# Patient Record
Sex: Male | Born: 1960 | Race: White | Hispanic: No | Marital: Single | Smoking: Current every day smoker
Health system: Southern US, Community
[De-identification: ages and names within clinical notes are randomized; demographics above are authoritative.]

## PROBLEM LIST (undated history)

## (undated) DIAGNOSIS — I440 Atrioventricular block, first degree: Secondary | ICD-10-CM

## (undated) DIAGNOSIS — I10 Essential (primary) hypertension: Secondary | ICD-10-CM

## (undated) HISTORY — DX: Essential (primary) hypertension: I10

---
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
## 2006-03-17 HISTORY — PX: HERNIA REPAIR: SHX51

## 2013-01-20 ENCOUNTER — Emergency Department (HOSPITAL_COMMUNITY): Payer: BC Managed Care – PPO

## 2013-01-20 ENCOUNTER — Encounter (HOSPITAL_COMMUNITY): Payer: Self-pay | Admitting: Emergency Medicine

## 2013-01-20 ENCOUNTER — Emergency Department (HOSPITAL_COMMUNITY)
Admission: EM | Admit: 2013-01-20 | Discharge: 2013-01-20 | Disposition: A | Discharge status: Home / Self Care | Payer: BC Managed Care – PPO | Attending: Emergency Medicine | Admitting: Emergency Medicine

## 2013-01-20 DIAGNOSIS — R296 Repeated falls: Secondary | ICD-10-CM | POA: Insufficient documentation

## 2013-01-20 DIAGNOSIS — M542 Cervicalgia: Secondary | ICD-10-CM | POA: Insufficient documentation

## 2013-01-20 DIAGNOSIS — Z7982 Long term (current) use of aspirin: Secondary | ICD-10-CM | POA: Insufficient documentation

## 2013-01-20 DIAGNOSIS — S0180XA Unspecified open wound of other part of head, initial encounter: Secondary | ICD-10-CM | POA: Insufficient documentation

## 2013-01-20 DIAGNOSIS — Z23 Encounter for immunization: Secondary | ICD-10-CM | POA: Insufficient documentation

## 2013-01-20 DIAGNOSIS — R51 Headache: Secondary | ICD-10-CM | POA: Insufficient documentation

## 2013-01-20 DIAGNOSIS — Z79899 Other long term (current) drug therapy: Secondary | ICD-10-CM | POA: Insufficient documentation

## 2013-01-20 DIAGNOSIS — S0181XA Laceration without foreign body of other part of head, initial encounter: Secondary | ICD-10-CM

## 2013-01-20 DIAGNOSIS — F17200 Nicotine dependence, unspecified, uncomplicated: Secondary | ICD-10-CM | POA: Insufficient documentation

## 2013-01-20 DIAGNOSIS — Y998 Other external cause status: Secondary | ICD-10-CM | POA: Insufficient documentation

## 2013-01-20 DIAGNOSIS — S0990XA Unspecified injury of head, initial encounter: Secondary | ICD-10-CM

## 2013-01-20 DIAGNOSIS — Y929 Unspecified place or not applicable: Secondary | ICD-10-CM | POA: Insufficient documentation

## 2013-01-20 DIAGNOSIS — F1010 Alcohol abuse, uncomplicated: Secondary | ICD-10-CM | POA: Insufficient documentation

## 2013-01-20 DIAGNOSIS — I440 Atrioventricular block, first degree: Secondary | ICD-10-CM | POA: Insufficient documentation

## 2013-01-20 HISTORY — DX: Atrioventricular block, first degree: I44.0

## 2013-01-20 MED ORDER — TETANUS-DIPHTH-ACELL PERTUSSIS 5-2.5-18.5 LF-MCG/0.5 IM SUSP
0.5000 mL | Freq: Once | INTRAMUSCULAR | Status: AC
  Administered 2013-01-20: 0.5 mL via INTRAMUSCULAR
  Filled 2013-01-20: qty 0.5

## 2013-01-20 MED ORDER — NAPROXEN 500 MG PO TABS: 500.0000 mg | ORAL_TABLET | Freq: Two times a day (BID) | ORAL | Status: DC

## 2013-01-20 MED ORDER — LIDOCAINE HCL (PF) 1 % IJ SOLN
5.0000 mL | Freq: Once | INTRAMUSCULAR | Status: AC
  Administered 2013-01-20: 5 mL via INTRADERMAL
  Filled 2013-01-20: qty 5
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
## 2013-01-20 NOTE — ED Notes (Signed)
Dr. Hyacinth Meeker at bedside suturing.  C-collar removed by Dr. Hyacinth Meeker.

## 2014-08-10 ENCOUNTER — Encounter: Payer: Self-pay | Admitting: Emergency Medicine

## 2014-08-10 ENCOUNTER — Emergency Department (INDEPENDENT_AMBULATORY_CARE_PROVIDER_SITE_OTHER): Payer: BLUE CROSS/BLUE SHIELD

## 2014-08-10 ENCOUNTER — Emergency Department
Admission: EM | Admit: 2014-08-10 | Discharge: 2014-08-10 | Disposition: A | Discharge status: Home / Self Care | Payer: BLUE CROSS/BLUE SHIELD | Source: Home / Self Care | Attending: Family Medicine | Admitting: Family Medicine

## 2014-08-10 DIAGNOSIS — R059 Cough, unspecified: Secondary | ICD-10-CM

## 2014-08-10 DIAGNOSIS — R05 Cough: Secondary | ICD-10-CM

## 2014-08-10 DIAGNOSIS — J449 Chronic obstructive pulmonary disease, unspecified: Secondary | ICD-10-CM

## 2014-08-10 DIAGNOSIS — J209 Acute bronchitis, unspecified: Secondary | ICD-10-CM

## 2014-08-10 MED ORDER — DOXYCYCLINE HYCLATE 100 MG PO CAPS: 100.0000 mg | ORAL_CAPSULE | Freq: Two times a day (BID) | ORAL | Status: DC

## 2014-08-10 MED ORDER — BENZONATATE 200 MG PO CAPS: 200.0000 mg | ORAL_CAPSULE | Freq: Every day | ORAL | Status: DC

## 2014-08-10 NOTE — ED Notes (Signed)
Congestion, rt ear pain, fatigue, cough, a little diarrhea, chills, slight body aches x 1 week

## 2014-08-10 NOTE — Discharge Instructions (Signed)
Take plain guaifenesin 1200mg (Mucinex) twice daily, with plenty of water, for cough and congestion.  Get adequate rest.   °May use Afrin nasal spray (or generic oxymetazoline) twice daily for about 5 days.  Also recommend using saline nasal spray several times daily and saline nasal irrigation (AYR is a common brand) °Try warm salt water gargles for sore throat.  °Stop all antihistamines for now, and other non-prescription cough/cold preparations. ° °Follow-up with family doctor if not improving about10 days.  °

## 2014-08-10 NOTE — ED Provider Notes (Signed)
CSN: [id number]     Arrival date & time 08/10/14  0818 History   First MD Initiated Contact with Patient 08/10/14 [phone]     Chief Complaint  Patient presents with  . URI     HPI Comments: Patient complains of onset of flu-like symptoms one week ago.  He has had a persistent productive cough with increased right chest congestion.  He remains excessively fatigued. He had an episode of right sided pneumonia several years ago and feels the same as then.  He continues to smoke.  The history is provided by the patient.    Past Medical History  Diagnosis Date  . Heart block, first degree    History reviewed. No pertinent past surgical history. No family history on file. History  Substance Use Topics  . Smoking status: Current Every Day Smoker -- 1.00 packs/day    Types: Cigarettes  . Smokeless tobacco: Not on file  . Alcohol Use: Yes    Review of Systems + sore throat, resolved + cough No pleuritic pain No wheezing + nasal congestion + post-nasal drainage No sinus pain/pressure No itchy/red eyes + right earache No hemoptysis No SOB No fever, + chills + nausea No vomiting No abdominal pain + diarrhea, resolved No urinary symptoms No skin rash + fatigue + myalgias + headache Used OTC meds without relief  Allergies  Review of patient's allergies indicates no known allergies.  Home Medications   Prior to Admission medications   Medication Sig Start Date End Date Taking? Authorizing Provider  guaiFENesin (MUCINEX) 600 MG 12 hr tablet Take by mouth 2 (two) times daily.   Yes Historical Provider, MD  irbesartan (AVAPRO) 150 MG tablet Take 150 mg by mouth daily.   Yes Historical Provider, MD  aspirin 325 MG tablet Take 325 mg by mouth daily.    Historical Provider, MD  benzonatate (TESSALON) 200 MG capsule Take 1 capsule (200 mg total) by mouth at bedtime. Take as needed for cough 08/10/14   Lattie Haw, MD  doxycycline (VIBRAMYCIN) 100 MG capsule Take 1 capsule (100 mg  total) by mouth 2 (two) times daily. Take with food. 08/10/14   Lattie Haw, MD  IRON PO Take 1 tablet by mouth daily.    Historical Provider, MD  naproxen (NAPROSYN) 500 MG tablet Take 1 tablet (500 mg total) by mouth 2 (two) times daily with a meal. 01/20/13   Vida Roller, MD  POTASSIUM PO Take 1 tablet by mouth daily.    Historical Provider, MD   BP 156/88 mmHg  Pulse 104  Temp(Src) 98.5 F (36.9 C) (Oral)  Ht  (1.778 m)  Wt 159 lb (72.122 kg)  BMI 22.81 kg/m2  SpO2 99% Physical Exam Nursing notes and Vital Signs reviewed. Appearance:  Patient appears stated age, and in no acute distress Eyes:  Pupils are equal, round, and reactive to light and accomodation.  Extraocular movement is intact.  Conjunctivae are not inflamed  Ears:  Canals normal.  Tympanic membranes normal.  Nose:   Congested turbinates, worse on the right.  No sinus tenderness.      Pharynx:  Normal Neck:  Supple.   Tender enlarged posterior nodes are palpated bilaterally  Lungs:  Clear to auscultation.  Breath sounds are equal.  Heart:  Regular rate and rhythm without murmurs, rubs, or gallops.  Rate 108 Abdomen:  Nontender without masses or hepatosplenomegaly.  Bowel sounds are present.  No CVA or flank tenderness.  Extremities:  No edema.  No calf tenderness Skin:  No rash present.   ED Course  Procedures  none      Imaging Review Dg Chest 2 View  08/10/2014   CLINICAL DATA:  Flu-like symptoms for 1 week  EXAM: CHEST  2 VIEW  COMPARISON:  None.  FINDINGS: Cardiac shadow is within normal limits. The lungs are hyperaerated bilaterally consistent with COPD. No focal infiltrate or sizable effusion is seen. No bony abnormality is noted.  IMPRESSION: COPD without acute abnormality.   Electronically Signed   By: Alcide CleverMark  Lukens M.D.   On: 08/10/2014 09:08     MDM   1. Acute bronchitis, unspecified organism   2. Cough    With evidence of COPD and past history of pneumonia in a smoker, begin doxycycline  for atypical coverage.  Prescription written for Benzonatate [facility](Tessalon) to take at bedtime for night-time cough.  Take plain guaifenesin 1200mg  (Mucinex) twice daily, with plenty of water, for cough and congestion.  Get adequate rest.   May use Afrin nasal spray (or generic oxymetazoline) twice daily for about 5 days.  Also recommend using saline nasal spray several times daily and saline nasal irrigation (AYR is a common brand) Try warm salt water gargles for sore throat.  Stop all antihistamines for now, and other non-prescription cough/cold preparations.  Follow-up with family doctor if not improving about10 days.     Lattie HawStephen A Estella Malatesta, MD 08/12/14 1048

## 2014-08-13 ENCOUNTER — Telehealth: Payer: Self-pay | Admitting: Emergency Medicine

## 2014-09-15 ENCOUNTER — Ambulatory Visit (INDEPENDENT_AMBULATORY_CARE_PROVIDER_SITE_OTHER): Payer: BLUE CROSS/BLUE SHIELD | Admitting: Family Medicine

## 2014-09-15 ENCOUNTER — Encounter: Payer: Self-pay | Admitting: Family Medicine

## 2014-09-15 VITALS — BP 155/82 | HR 95 | Ht 70.0 in | Wt 163.0 lb

## 2014-09-15 DIAGNOSIS — F411 Generalized anxiety disorder: Secondary | ICD-10-CM

## 2014-09-15 DIAGNOSIS — I10 Essential (primary) hypertension: Secondary | ICD-10-CM | POA: Diagnosis not present

## 2014-09-15 DIAGNOSIS — F1090 Alcohol use, unspecified, uncomplicated: Secondary | ICD-10-CM | POA: Insufficient documentation

## 2014-09-15 DIAGNOSIS — F1010 Alcohol abuse, uncomplicated: Secondary | ICD-10-CM

## 2014-09-15 DIAGNOSIS — Z7289 Other problems related to lifestyle: Secondary | ICD-10-CM

## 2014-09-15 HISTORY — DX: Essential (primary) hypertension: I10

## 2014-09-15 MED ORDER — VENLAFAXINE HCL ER 75 MG PO CP24: 75.0000 mg | ORAL_CAPSULE | Freq: Every day | ORAL | Status: DC

## 2014-09-15 MED ORDER — LORAZEPAM 1 MG PO TABS: 1.0000 mg | ORAL_TABLET | Freq: Three times a day (TID) | ORAL | Status: DC | PRN

## 2014-09-15 NOTE — Progress Notes (Signed)
CC: Dean Osborne is a 54 y.o. male is here for Establish Care   Subjective: HPI:  Very pleasant 54 year old here to establish care  Patient tells me last week he had numbness on his entire left side of the body and went to a local emergency room and had unremarkable lab work and a normal MRI of the brain. His symptoms were relieved after he received Ativan in the emergency room.    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 He tells me that he constantly is bothered by this and it was interfering with sleep in the past. Last year he was self-medicating with alcohol and admits to drinking to the point of blacking out however as of January 1 use successfully been able to cut back to on average 3 drinks a day, always after work. Since being prescribed Ativan he tells me that he is able to deal with the stress of his work place doesn't seem to be bringing home any irritability or anxiety that he was experiencing in the past. He's taking 1-2 Ativan on a daily basis depending on how stressful work event. He denies any other mental disturbance. He tells me that he thinks about the job situation above constantly, at least every hour. He's had no new neurological or motor/sensory disturbances other than that described above since leaving the emergency room.  History of essential hypertension.  He's been prescribed Avapro in the past but currently is not taking this. There are no known side effects to this medication that he is experiencing.  Review of Systems - General ROS: negative for - chills, fever, night sweats, weight gain or weight loss Ophthalmic ROS: negative for - decreased  vision Psychological ROS: negative for - anxiety ENT ROS: negative for - hearing change, nasal congestion, tinnitus or allergies Hematological and Lymphatic ROS: negative for - bleeding problems, bruising or swollen lymph nodes Breast ROS: negative Respiratory ROS: no cough, shortness of breath, or wheezing Cardiovascular ROS: no chest pain or dyspnea on exertion Gastrointestinal ROS: no abdominal pain, change in bowel habits, or black or bloody stools Genito-Urinary ROS: negative for - genital discharge, genital ulcers, incontinence or abnormal bleeding from genitals Musculoskeletal ROS: negative for - joint pain or muscle pain Neurological ROS: negative for - headaches or memory loss Dermatological ROS: negative for lumps, mole changes, rash and skin lesion changes  Past Medical History  Diagnosis Date  . Heart block, first degree   . Hypertension   . Essential hypertension 09/15/2014    Past Surgical History  Procedure Laterality Date  . Hernia repair  03/2006  . Knee surgery  10/1971   Family History  Problem Relation Age of Onset  . Lung cancer Maternal Grandfather   . Diabetes Maternal Grandmother     History   Social History  . Marital Status: Single    Spouse Name: N/A  . Number of Children: N/A  . Years of Education: N/A   Occupational History  . Not on file.   Social History Main Topics  . Smoking status: Current Every Day Smoker -- 1.00 packs/day for 35 years    Types: Cigarettes  . Smokeless tobacco: Not  on file  . Alcohol Use: 12.0 oz/week    20 Standard drinks or equivalent per week  . Drug Use: No  . Sexual Activity: Not Currently   Other Topics Concern  . Not on file   Social History Narrative     Objective: BP 155/82 mmHg  Pulse 95  Ht 5\' 10"  (1.778 m)  Wt 163 lb (73.936 kg)  BMI 23.39 kg/m2  Vital signs reviewed. General: Alert and Oriented, No Acute Distress HEENT: Pupils equal, round, reactive to light. Conjunctivae clear.  External  ears unremarkable.  Moist mucous membranes. Lungs: Clear and comfortable work of breathing, speaking in full sentences without accessory muscle use. Cardiac: Regular rate and rhythm.  Neuro: CN II-XII grossly intact, gait normal. Extremities: No peripheral edema.  Strong peripheral pulses.  Mental Status: Mild to moderate anxiety without depression or agitation. Logical though process. Skin: Warm and dry. Assessment & Plan: Dean Osborne was seen today for establish care.  Diagnoses and all orders for this visit:  Essential hypertension  Generalized anxiety disorder Orders: -     venlafaxine XR (EFFEXOR XR) 75 MG 24 hr capsule; Take 1 capsule (75 mg total) by mouth daily with breakfast. -     LORazepam (ATIVAN) 1 MG tablet; Take 1 tablet (1 mg total) by mouth every 8 (eight) hours as needed for anxiety.  Habitual alcohol use   Essential hypertension: Uncontrolled chronic condition, Urged to restart Avapro Generalized anxiety disorder: Discussed that daily use of Ativan is not the best course of action for management of his anxiety instead he should start on Effexor, for prescription was provided today. Anticipate he'll need to use Ativan for the next 1 or 2 weeks while Effexor becomes fully effective. Habitual alcohol use: Discussed cutting back on his alcohol use and to ensure that he is not consuming more than 3 alcohol beverages a day.  Return in about 4 weeks (around 10/13/2014) for Mood.

## 2014-10-13 ENCOUNTER — Encounter: Payer: Self-pay | Admitting: Family Medicine

## 2014-10-13 ENCOUNTER — Ambulatory Visit (INDEPENDENT_AMBULATORY_CARE_PROVIDER_SITE_OTHER): Payer: BLUE CROSS/BLUE SHIELD | Admitting: Family Medicine

## 2014-10-13 VITALS — BP 165/86 | HR 80 | Wt 155.0 lb

## 2014-10-13 DIAGNOSIS — I10 Essential (primary) hypertension: Secondary | ICD-10-CM

## 2014-10-13 DIAGNOSIS — F411 Generalized anxiety disorder: Secondary | ICD-10-CM

## 2014-10-13 MED ORDER — IRBESARTAN 150 MG PO TABS: 150.0000 mg | ORAL_TABLET | Freq: Every day | ORAL | Status: DC

## 2014-10-13 MED ORDER — VENLAFAXINE HCL ER 75 MG PO CP24: 75.0000 mg | ORAL_CAPSULE | Freq: Every day | ORAL | Status: DC

## 2014-10-13 MED ORDER — LORAZEPAM 1 MG PO TABS: 1.0000 mg | ORAL_TABLET | Freq: Three times a day (TID) | ORAL | Status: DC | PRN

## 2014-10-13 NOTE — Progress Notes (Signed)
CC: Dean Osborne is a 54 y.o. male is here for f/u mood   Subjective: HPI:   Follow-up anxiety: Work situation has not changed, still highly stressful however he feels like the Effexor is done a significant improvement with him not letting stress get to him. He tells me that he does not dread going to work anymore. He has no difficulty falling asleep now. He does not feel like he brings home stress from work.  He still uses Ativan to help stay asleep. Denies any known side effects from the above medications. Denies thoughts of wanting to self or others. Overall he is extremely happy with the effect of Effexor without any noted side effects.  Follow-up essential hypertension: No outside blood pressures report. No chest pain shortness of breath orthopnea or peripheral edema. He is taking Avapro in the past without any known side effects and it's always brought his blood pressure to the normotensive range per his report. He's been reluctant about restarting this because he wanted to make sure that his other psychiatric medications were stabilized first.   Review Of Systems Outlined In HPI  Past Medical History  Diagnosis Date  . Heart block, first degree   . Hypertension   . Essential hypertension 09/15/2014    Past Surgical History  Procedure Laterality Date  . Hernia repair  03/2006  . Knee surgery  10/1971   Family History  Problem Relation Age of Onset  . Lung cancer Maternal Grandfather   . Diabetes Maternal Grandmother     History   Social History  . Marital Status: Single    Spouse Name: N/A  . Number of Children: N/A  . Years of Education: N/A   Occupational History  . Not on file.   Social History Main Topics  . Smoking status: Current Every Day Smoker -- 1.00 packs/day for 35 years    Types: Cigarettes  . Smokeless tobacco: Not on file  . Alcohol Use: 12.0 oz/week    20 Standard drinks or equivalent per week  . Drug Use: No  . Sexual Activity: Not Currently    Other Topics Concern  . Not on file   Social History Narrative     Objective: BP 165/86 mmHg  Pulse 80  Wt 155 lb (70.308 kg)  Vital signs reviewed. General: Alert and Oriented, No Acute Distress HEENT: Pupils equal, round, reactive to light. Conjunctivae clear.  External ears unremarkable.  Moist mucous membranes. Lungs: Clear and comfortable work of breathing, speaking in full sentences without accessory muscle use. Cardiac: Regular rate and rhythm.  Neuro: CN II-XII grossly intact, gait normal. Extremities: No peripheral edema.  Strong peripheral pulses.  Mental Status: No depression, anxiety, nor agitation. Logical though process. Skin: Warm and dry. Assessment & Plan: Shah was seen today for f/u mood.  Diagnoses and all orders for this visit:  Essential hypertension  Generalized anxiety disorder Orders: -     LORazepam (ATIVAN) 1 MG tablet; Take 1 tablet (1 mg total) by mouth every 8 (eight) hours as needed for anxiety. -     venlafaxine XR (EFFEXOR XR) 75 MG 24 hr capsule; Take 1 capsule (75 mg total) by mouth daily with breakfast.  Other orders -     irbesartan (AVAPRO) 150 MG tablet; Take 1 tablet (150 mg total) by mouth daily.   Anxiety: Greatly improved with Effexor continue as needed Ativan with daily Effexor. Encouraged him to not cut these medications off cold [place] of his work situation improves  instead come back and see me and we'll discuss a taper regimen Essential hypertension: Uncontrolled chronic condition restart Avapro. Return in one month blood pressures are above 140/90 otherwise 6 months.  Return in about 6 months (around 04/15/2015) for mood.

## 2014-12-15 ENCOUNTER — Other Ambulatory Visit: Payer: Self-pay | Admitting: Family Medicine

## 2014-12-16 ENCOUNTER — Other Ambulatory Visit: Payer: Self-pay | Admitting: *Deleted

## 2014-12-16 DIAGNOSIS — F411 Generalized anxiety disorder: Secondary | ICD-10-CM

## 2014-12-16 MED ORDER — LORAZEPAM 1 MG PO TABS: 1.0000 mg | ORAL_TABLET | Freq: Three times a day (TID) | ORAL | Status: DC | PRN

## 2015-02-11 ENCOUNTER — Other Ambulatory Visit: Payer: Self-pay | Admitting: Family Medicine

## 2015-02-16 ENCOUNTER — Ambulatory Visit (INDEPENDENT_AMBULATORY_CARE_PROVIDER_SITE_OTHER): Payer: BLUE CROSS/BLUE SHIELD | Admitting: Family Medicine

## 2015-02-16 ENCOUNTER — Encounter: Payer: Self-pay | Admitting: Family Medicine

## 2015-02-16 VITALS — BP 96/67 | HR 121 | Temp 97.8°F | Wt 145.0 lb

## 2015-02-16 DIAGNOSIS — J189 Pneumonia, unspecified organism: Secondary | ICD-10-CM

## 2015-02-16 MED ORDER — CEFDINIR 300 MG PO CAPS: 300.0000 mg | ORAL_CAPSULE | Freq: Two times a day (BID) | ORAL | Status: DC

## 2015-02-16 NOTE — Progress Notes (Signed)
CC: Dean Osborne is a 54 y.o. male is here for Fatigue and Cough   Subjective: HPI:  Complains of productive cough with green sputum but no blood in sputum. Symptoms are present all hours of the day and somewhat interfere with sleep. Symptoms have been worsening over the last week. Now symptoms are moderate in severity. No benefit from resting no other interventions as of yet. Continues to smoke. Occasional shortness of breath. But never with resting or lying down. Denies wheezing or chest discomfort. Reports fatigue to moderate to severe degree since the cough began. Denies any abdominal pain, diarrhea, constipation, blood in stool, dysuria nor rash.   Review Of Systems Outlined In HPI  Past Medical History  Diagnosis Date  . Heart block, first degree   . Hypertension   . Essential hypertension 09/15/2014    Past Surgical History  Procedure Laterality Date  . Hernia repair  03/2006  . Knee surgery  10/1971   Family History  Problem Relation Age of Onset  . Lung cancer Maternal Grandfather   . Diabetes Maternal Grandmother     History   Social History  . Marital Status: Single    Spouse Name: N/A  . Number of Children: N/A  . Years of Education: N/A   Occupational History  . Not on file.   Social History Main Topics  . Smoking status: Current Every Day Smoker -- 1.00 packs/day for 35 years    Types: Cigarettes  . Smokeless tobacco: Not on file  . Alcohol Use: 12.0 oz/week    20 Standard drinks or equivalent per week  . Drug Use: No  . Sexual Activity: Not Currently   Other Topics Concern  . Not on file   Social History Narrative     Objective: BP 96/67 mmHg  Pulse 121  Temp(Src) 97.8 F (36.6 C) (Oral)  Wt 145 lb (65.772 kg)  SpO2 98%  General: Alert and Oriented, No Acute Distress but appears fatigued HEENT: Pupils equal, round, reactive to light. Conjunctivae clear.  External ears unremarkable, canals clear with intact TMs with appropriate  landmarks.  Middle ear appears open without effusion. Pink inferior turbinates.  Moist mucous membranes, pharynx without inflammation nor lesions.  Neck supple without palpable lymphadenopathy nor abnormal masses. Lungs: Clear to auscultation bilaterally, no wheezing/ronchi/rales.  Comfortable work of breathing. Good air movement. Cardiac: mild tachycardia. Normal S1/S2.  No murmurs, rubs, nor gallops.   Extremities: No peripheral edema.  Strong peripheral pulses.  Mental Status: No depression, anxiety, nor agitation. Skin: Warm and dry.  Assessment & Plan: Dean Osborne was seen today for fatigue and cough.  Diagnoses and all orders for this visit:  CAP (community acquired pneumonia) Orders: -     cefdinir (OMNICEF) 300 MG capsule; Take 1 capsule (300 mg total) by mouth 2 (two) times daily.   Suspect community acquired pneumonia: Offered x-ray but advised that I would still give him Omnicef even if the x-ray was clear. Joint decision to start Omnicef and obtain x-rays only if not improving by Thursday.   Return if symptoms worsen or fail to improve.

## 2015-02-18 ENCOUNTER — Telehealth: Payer: Self-pay | Admitting: *Deleted

## 2015-02-18 NOTE — Telephone Encounter (Signed)
Nyquil is perfectly fine, another option would be Unisom if he just wants to sleep and is not having any upper or lower respiratory symptoms

## 2015-02-18 NOTE — Telephone Encounter (Signed)
Left message on pt's vm  With advice

## 2015-02-18 NOTE — Telephone Encounter (Signed)
Pt states that ever since he started the abx rx'ed earlier this week for pneumonia he has not been able to sleep. Pt states he does feel better but he just feel "foggy" from the lack of sleep. I spoke with the patient and told him that I didn't think he had the antibiotic would cause him to not be able to sleep. I asked pt if he has been taking his lorazepam and he states he does take it once a day and that all he usually has to take it. That usually will help him to sleep.Pt wants to know if he can take Nyquil at night to help him to sleep and wants to be sure this will not interfere with his other medications

## 2015-02-23 ENCOUNTER — Encounter: Payer: Self-pay | Admitting: Family Medicine

## 2015-02-23 ENCOUNTER — Ambulatory Visit (INDEPENDENT_AMBULATORY_CARE_PROVIDER_SITE_OTHER): Payer: BLUE CROSS/BLUE SHIELD | Admitting: Family Medicine

## 2015-02-23 VITALS — BP 124/78 | HR 114 | Wt 148.0 lb

## 2015-02-23 DIAGNOSIS — J189 Pneumonia, unspecified organism: Secondary | ICD-10-CM | POA: Diagnosis not present

## 2015-02-23 DIAGNOSIS — F1010 Alcohol abuse, uncomplicated: Secondary | ICD-10-CM | POA: Diagnosis not present

## 2015-02-23 DIAGNOSIS — F1090 Alcohol use, unspecified, uncomplicated: Secondary | ICD-10-CM

## 2015-02-23 DIAGNOSIS — Z7289 Other problems related to lifestyle: Secondary | ICD-10-CM

## 2015-02-23 MED ORDER — LORAZEPAM 1 MG PO TABS: ORAL_TABLET | ORAL | Status: DC

## 2015-02-23 NOTE — Progress Notes (Signed)
CC: Dean Osborne is a 54 y.o. male is here for Fatigue   Subjective: HPI:  Follow-up community acquired pneumonia: He is about to finish up his Omnicef regimen, he believes it caused some side effects of insomnia initially but this is no longer an issue. His cough is much less productive, he is only mildly short of breath now and has no chest pain. He reports moderate fatigue on a daily basis now. He denies fevers, chills, confusion, nor swollen lymph nodes. He's back to his regular diet  He wants to know if there something that can help him with complete alcohol cessation. He is planning on no longer drinking as of today. He usually drinks on a daily basis after he gets home from work, he tells me he never drinks at work over for work. His last drink was last night. In the past He's been able to quit for 2 days consecutively without any withdrawal symptoms.   Review Of Systems Outlined In HPI  Past Medical History  Diagnosis Date  . Heart block, first degree   . Hypertension   . Essential hypertension 09/15/2014    Past Surgical History  Procedure Laterality Date  . Hernia repair  03/2006  . Knee surgery  10/1971   Family History  Problem Relation Age of Onset  . Lung cancer Maternal Grandfather   . Diabetes Maternal Grandmother     History   Social History  . Marital Status: Single    Spouse Name: N/A  . Number of Children: N/A  . Years of Education: N/A   Occupational History  . Not on file.   Social History Main Topics  . Smoking status: Current Every Day Smoker -- 1.00 packs/day for 35 years    Types: Cigarettes  . Smokeless tobacco: Not on file  . Alcohol Use: 12.0 oz/week    20 Standard drinks or equivalent per week  . Drug Use: No  . Sexual Activity: Not Currently   Other Topics Concern  . Not on file   Social History Narrative     Objective: BP 124/78 mmHg  Pulse 114  Wt 148 lb (67.132 kg)  General: Alert and Oriented, No Acute Distress HEENT:  Pupils equal, round, reactive to light. Conjunctivae clear.  External ears unremarkable, canals clear with intact TMs with appropriate landmarks.  Middle ear appears open without effusion. Pink inferior turbinates.  Moist mucous membranes, pharynx without inflammation nor lesions.  Neck supple without palpable lymphadenopathy nor abnormal masses. Lungs: Clear to auscultation bilaterally, no wheezing/ronchi/rales.  Comfortable work of breathing. Good air movement. Cardiac: Regular rate and rhythm. Normal S1/S2.  No murmurs, rubs, nor gallops.   Mental Status: No depression, anxiety, nor agitation. Skin: Warm and dry.  Assessment & Plan: Alphonsus was seen today for fatigue.  Diagnoses and all orders for this visit:  CAP (community acquired pneumonia)  Habitual alcohol use  Other orders -     LORazepam (ATIVAN) 1 MG tablet; To help with alcohol cessation: Take one by mouth spaced out four times a day on day one, then three spaced out on day two, then two spaced out on day three, then go back to your daily as needed regimen.   Habitual alcohol use: Applauded his desire to stop drinking. To help minimize withdrawal symptoms and cravings discussed the above Ativan taper for the first 4 days after cessation of alcohol, he could start this today since he'll be at home recovering from pneumonia until Friday. He acquired pneumonia:  Improving I suspect he'll be cleared to return to work after Friday this week.  Return if symptoms worsen or fail to improve.

## 2015-03-02 ENCOUNTER — Ambulatory Visit (INDEPENDENT_AMBULATORY_CARE_PROVIDER_SITE_OTHER): Payer: BLUE CROSS/BLUE SHIELD | Admitting: Family Medicine

## 2015-03-02 ENCOUNTER — Encounter: Payer: Self-pay | Admitting: Family Medicine

## 2015-03-02 VITALS — BP 135/82 | HR 95 | Wt 149.0 lb

## 2015-03-02 DIAGNOSIS — Z7289 Other problems related to lifestyle: Secondary | ICD-10-CM

## 2015-03-02 DIAGNOSIS — F1010 Alcohol abuse, uncomplicated: Secondary | ICD-10-CM

## 2015-03-02 DIAGNOSIS — F411 Generalized anxiety disorder: Secondary | ICD-10-CM | POA: Diagnosis not present

## 2015-03-02 DIAGNOSIS — F1090 Alcohol use, unspecified, uncomplicated: Secondary | ICD-10-CM

## 2015-03-02 NOTE — Progress Notes (Signed)
CC: Dean Osborne is a 54 y.o. male is here for f/u fatigue   Subjective: HPI:  He believes that his breathing status is back to its normal state of health. He's no longer having any cough, shortness of breath, chest discomfort or fatigue. He tells me he feels great.  He is also let his employer know that he believes he has a problem with habitual alcohol use on a daily basis. Since I saw him last has been able to stop all alcohol use on the nights before work days. He tells me he's been able to switch daily drinking habit to daily exercise habit. He notices on the weekdays that if he is getting anxious or nervous at the end of the day once he gets home he'll take an Ativan and have a productive nonstressful evening without picking about alcohol. Anxiety is predominantly due to job responsibilities. He has brought in paperwork today for FMLA and short-term disability that he would like to have filled out. Denies tremor, hallucinations, nor difficulty sleeping   Review Of Systems Outlined In HPI  Past Medical History  Diagnosis Date  . Heart block, first degree   . Hypertension   . Essential hypertension 09/15/2014    Past Surgical History  Procedure Laterality Date  . Hernia repair  03/2006  . Knee surgery  10/1971   Family History  Problem Relation Age of Onset  . Lung cancer Maternal Grandfather   . Diabetes Maternal Grandmother     Social History   Social History  . Marital Status: Single    Spouse Name: N/A  . Number of Children: N/A  . Years of Education: N/A   Occupational History  . Not on file.   Social History Main Topics  . Smoking status: Current Every Day Smoker -- 1.00 packs/day for 35 years    Types: Cigarettes  . Smokeless tobacco: Not on file  . Alcohol Use: 12.0 oz/week    20 Standard drinks or equivalent per week  . Drug Use: No  . Sexual Activity: Not Currently   Other Topics Concern  . Not on file   Social History Narrative      Objective: BP 135/82 mmHg  Pulse 95  Wt 149 lb (67.586 kg)  SpO2 99%  Vital signs reviewed. General: Alert and Oriented, No Acute Distress HEENT: Pupils equal, round, reactive to light. Conjunctivae clear.  External ears unremarkable.  Moist mucous membranes. Lungs: Clear and comfortable work of breathing, speaking in full sentences without accessory muscle use. Cardiac: Regular rate and rhythm.  Neuro: CN II-XII grossly intact, gait normal. Extremities: No peripheral edema.  Strong peripheral pulses.  Mental Status: No depression, anxiety, nor agitation. Logical though process. Skin: Warm and dry.  Assessment & Plan: [name] was seen today for f/u fatigue.  Diagnoses and all orders for this visit:  Generalized anxiety disorder  Habitual alcohol use   Generalized anxiety disorder: Controlled with daily Effexor and as needed Ativan. Discussed that if he starts taking Ativan on a daily basis for severe red flag that Effexor needs to be adjusted. Habitual alcohol use: Congratulated his cutting back on alcohol. Reminded him that less alcohol is better, complete cessation is optimal.  Time was taken to fill out his paperwork in the presence of the patient.  25 minutes spent face-to-face during visit today of which at least 50% was counseling or coordinating care regarding: 1. Generalized anxiety disorder   2. Habitual alcohol use  Return in about 2 months (around 05/02/2015).

## 2015-03-04 ENCOUNTER — Telehealth: Payer: Self-pay | Admitting: Family Medicine

## 2015-03-04 NOTE — Telephone Encounter (Signed)
Sue Lush, Will you please let patient know that I've completed his FMLA and disability forms.  He'll need to add his SS number to the disability paper that has a blue post-it on it.

## 2015-03-04 NOTE — Telephone Encounter (Signed)
Message left on vm 

## 2015-04-16 ENCOUNTER — Other Ambulatory Visit: Payer: Self-pay | Admitting: Family Medicine

## 2015-05-03 ENCOUNTER — Ambulatory Visit: Payer: BLUE CROSS/BLUE SHIELD | Admitting: Family Medicine

## 2015-05-10 ENCOUNTER — Encounter: Payer: Self-pay | Admitting: Family Medicine

## 2015-05-10 ENCOUNTER — Ambulatory Visit (INDEPENDENT_AMBULATORY_CARE_PROVIDER_SITE_OTHER): Payer: BLUE CROSS/BLUE SHIELD | Admitting: Family Medicine

## 2015-05-10 VITALS — BP 126/76 | HR 84 | Wt 153.0 lb

## 2015-05-10 DIAGNOSIS — F1090 Alcohol use, unspecified, uncomplicated: Secondary | ICD-10-CM

## 2015-05-10 DIAGNOSIS — F1010 Alcohol abuse, uncomplicated: Secondary | ICD-10-CM

## 2015-05-10 DIAGNOSIS — F411 Generalized anxiety disorder: Secondary | ICD-10-CM

## 2015-05-10 DIAGNOSIS — Z7289 Other problems related to lifestyle: Secondary | ICD-10-CM

## 2015-05-10 MED ORDER — VENLAFAXINE HCL ER 75 MG PO CP24: 75.0000 mg | ORAL_CAPSULE | Freq: Every day | ORAL | Status: DC

## 2015-05-10 MED ORDER — LORAZEPAM 1 MG PO TABS: ORAL_TABLET | ORAL | Status: DC

## 2015-05-10 NOTE — Progress Notes (Signed)
CC: [name] is a 54 y.o. male is here for Follow-up   Subjective: HPI:  Patient states he is feeling great, he's very pleased with the effect of Effexor with reducing anxiety. He takes Ativan to go to bed every night. He's drastically cut back on his alcohol use and is no longer interfering with work. He had some irritability towards coworkers last week but this was due to scheduling errors. He denies any depression or paranoia. Denies any sleep disturbance or appetite changes.   Review Of Systems Outlined In HPI  Past Medical History  Diagnosis Date  . Heart block, first degree   . Hypertension   . Essential hypertension 09/15/2014    Past Surgical History  Procedure Laterality Date  . Hernia repair  03/2006  . Knee surgery  10/1971   Family History  Problem Relation Age of Onset  . Lung cancer Maternal Grandfather   . Diabetes Maternal Grandmother     Social History   Social History  . Marital Status: Single    Spouse Name: N/A  . Number of Children: N/A  . Years of Education: N/A   Occupational History  . Not on file.   Social History Main Topics  . Smoking status: Current Every Day Smoker -- 1.00 packs/day for 35 years    Types: Cigarettes  . Smokeless tobacco: Not on file  . Alcohol Use: 12.0 oz/week    20 Standard drinks or equivalent per week  . Drug Use: No  . Sexual Activity: Not Currently   Other Topics Concern  . Not on file   Social History Narrative     Objective: BP 126/76 mmHg  Pulse 84  Wt 153 lb (69.4 kg)  Vital signs reviewed. General: Alert and Oriented, No Acute Distress HEENT: Pupils equal, round, reactive to light. Conjunctivae clear.  External ears unremarkable.  Moist mucous membranes. Lungs: Clear and comfortable work of breathing, speaking in full sentences without accessory muscle use. Cardiac: Regular rate and rhythm.  Neuro: CN II-XII grossly intact, gait normal. Extremities: No peripheral edema.  Strong peripheral  pulses.  Mental Status: No depression, anxiety, nor agitation. Logical though process. Skin: Warm and dry.  Assessment & Plan: Dean Osborne was seen today for follow-up.  Diagnoses and all orders for this visit:  Generalized anxiety disorder -     venlafaxine XR (EFFEXOR XR) 75 MG 24 hr capsule; Take 1 capsule (75 mg total) by mouth daily with breakfast.  Habitual alcohol use  Other orders -     LORazepam (ATIVAN) 1 MG tablet; TAKE 1 TABLET EVERY 8 HOURS AS NEEDED FOR ANXIETY   Generalized anxiety: Controlled with Effexor and lorazepam. Congratulated his success with significant alcohol reduction  Return in about 4 weeks (around 06/07/2015) for CPE.

## 2015-06-07 ENCOUNTER — Encounter: Payer: Self-pay | Admitting: Family Medicine

## 2015-06-07 ENCOUNTER — Ambulatory Visit (INDEPENDENT_AMBULATORY_CARE_PROVIDER_SITE_OTHER): Payer: BLUE CROSS/BLUE SHIELD | Admitting: Family Medicine

## 2015-06-07 VITALS — BP 148/89 | HR 76 | Wt 159.0 lb

## 2015-06-07 DIAGNOSIS — Z1211 Encounter for screening for malignant neoplasm of colon: Secondary | ICD-10-CM

## 2015-06-07 DIAGNOSIS — Z0000 Encounter for general adult medical examination without abnormal findings: Secondary | ICD-10-CM | POA: Diagnosis not present

## 2015-06-07 LAB — COMPLETE METABOLIC PANEL WITH GFR
ALBUMIN: 4.4 g/dL (ref 3.6–5.1)
ALK PHOS: 56 U/L (ref 40–115)
ALT: 19 U/L (ref 9–46)
AST: 27 U/L (ref 10–35)
BUN: 11 mg/dL (ref 7–25)
CALCIUM: 9.5 mg/dL (ref 8.6–10.3)
CHLORIDE: 100 mmol/L (ref 98–110)
CO2: 26 mmol/L (ref 20–31)
CREATININE: 0.97 mg/dL (ref 0.70–1.33)
GFR, Est African American: >89 mL/min (ref >=60)
GFR, Est Non African American: 87 mL/min (ref >=60)
Glucose, Bld: 112 mg/dL — ABNORMAL HIGH (ref 65–99)
Potassium: 5.3 mmol/L (ref 3.5–5.3)
Sodium: 138 mmol/L (ref 135–146)
TOTAL PROTEIN: 7.2 g/dL (ref 6.1–8.1)
Total Bilirubin: 0.6 mg/dL (ref 0.2–1.2)

## 2015-06-07 LAB — CBC
HEMATOCRIT: 43.7 % (ref 39.0–52.0)
Hemoglobin: 14.6 g/dL (ref 13.0–17.0)
MCH: 33 pg (ref 26.0–34.0)
MCHC: 33.4 g/dL (ref 30.0–36.0)
MCV: 98.6 fL (ref 78.0–100.0)
MPV: 10.6 fL (ref 8.6–12.4)
Platelets: 333 10*3/uL (ref 150–400)
RBC: 4.43 MIL/uL (ref 4.22–5.81)
RDW: 13.1 % (ref 11.5–15.5)
WBC: 9.8 10*3/uL (ref 4.0–10.5)

## 2015-06-07 LAB — LIPID PANEL
CHOLESTEROL: 235 mg/dL — AB (ref 125–200)
HDL: 61 mg/dL (ref >=40)
LDL Cholesterol: 135 mg/dL — ABNORMAL HIGH (ref <130)
Total CHOL/HDL Ratio: 3.9 Ratio (ref <=5.0)
Triglycerides: 193 mg/dL — ABNORMAL HIGH (ref <150)
VLDL: 39 mg/dL — AB (ref <30)

## 2015-06-07 NOTE — Patient Instructions (Signed)
Dr. Paulyne Mooty's General Advice Following Your Complete Physical Exam  The Benefits of Regular Exercise: Unless you suffer from an uncontrolled cardiovascular condition, studies strongly suggest that regular exercise and physical activity will add to both the quality and length of your life.  The World Health Organization recommends 150 minutes of moderate intensity aerobic activity every week.  This is best split over 3-4 days a week, and can be as simple as a brisk walk for just over 35 minutes "most days of the week".  This type of exercise has been shown to lower LDL-Cholesterol, lower average blood sugars, lower blood pressure, lower cardiovascular disease risk, improve memory, and increase one's overall sense of wellbeing.  The addition of anaerobic (or "strength training") exercises offers additional benefits including but not limited to increased metabolism, prevention of osteoporosis, and improved overall cholesterol levels.  How Can I Strive For A Low-Fat Diet?: Current guidelines recommend that 25-35 percent of your daily energy (food) intake should come from fats.  One might ask how can this be achieved without having to dissect each meal on a daily basis?  Switch to skim or 1% milk instead of whole milk.  Focus on lean meats such as ground turkey, fresh fish, baked chicken, and lean cuts of beef as your source of dietary protein.  Limit saturated fat consumption to less than 10% of your daily caloric intake.  Limit trans fatty acid consumption primarily by limiting synthetic trans fats such as partially hydrogenated oils (Ex: fried fast foods).  Substitute olive or vegetable oil for solid fats where possible.  Moderation of Salt Intake: Provided you Dean't carry a diagnosis of congestive heart failure nor renal failure, I recommend a daily allowance of no more than 2300 mg of salt (sodium).  Keeping under this daily goal is associated with a decreased risk of cardiovascular events, creeping  above it can lead to elevated blood pressures and increases your risk of cardiovascular events.  Milligrams (mg) of salt is listed on all nutrition labels, and your daily intake can add up faster than you think.  Most canned and frozen dinners can pack in over half your daily salt allowance in one meal.    Lifestyle Health Risks: Certain lifestyle choices carry specific health risks.  As you may already know, tobacco use has been associated with increasing one's risk of cardiovascular disease, pulmonary disease, numerous cancers, among many other issues.  What you may not know is that there are medications and nicotine replacement strategies that can more than double your chances of successfully quitting.  I would be thrilled to help manage your quitting strategy if you currently use tobacco products.  When it comes to alcohol use, I've yet to find an "ideal" daily allowance.  Provided an individual does not have a medical condition that is exacerbated by alcohol consumption, general guidelines determine "safe drinking" as no more than two standard drinks for a man or no more than one standard drink for a male per day.  However, much debate still exists on whether any amount of alcohol consumption is technically "safe".  My general advice, keep alcohol consumption to a minimum for general health promotion.  If you or others believe that alcohol, tobacco, or recreational drug use is interfering with your life, I would be happy to provide confidential counseling regarding treatment options.  General "Over The Counter" Nutrition Advice: Postmenopausal women should aim for a daily calcium intake of 1200 mg, however a significant portion of this might already be   provided by diets including milk, yogurt, cheese, and other dairy products.  Vitamin D has been shown to help preserve bone density, prevent fatigue, and has even been shown to help reduce falls in the elderly.  Ensuring a daily intake of 800 Units of  Vitamin D is a good place to start to enjoy the above benefits, we can easily check your Vitamin D level to see if you'd potentially benefit from supplementation beyond 800 Units a day.  Folic Acid intake should be of particular concern to women of childbearing age.  Daily consumption of 400-800 mcg of Folic Acid is recommended to minimize the chance of spinal cord defects in a fetus should pregnancy occur.    For many adults, accidents still remain one of the most common culprits when it comes to cause of death.  Some of the simplest but most effective preventitive habits you can adopt include regular seatbelt use, proper helmet use, securing firearms, and regularly testing your smoke and carbon monoxide detectors.  Dean Osborne B. Meshell Abdulaziz DO [facility] [address], [address] [place], [place] [address] Phone: [phone]  

## 2015-06-07 NOTE — Progress Notes (Signed)
CC: Dean Osborne is a 54 y.o. male is here for Annual Exam   Subjective: HPI:  Colonoscopy: Overdue for initial screening colonoscopy, referral has been placed Prostate: Discussed screening risks/beneifts with patient today, PSA was obtained   Influenza Vaccine: Up-to-date Pneumovax: No current indication Td/Tdap: Up-to-date Zoster: (Start 56 yo)   Requesting complete physical exam with no acute complaints   Review of Systems - General ROS: negative for - chills, fever, night sweats, weight gain or weight loss Ophthalmic ROS: negative for - decreased vision Psychological ROS: negative for - anxiety or depression ENT ROS: negative for - hearing change, nasal congestion, tinnitus or allergies Hematological and Lymphatic ROS: negative for - bleeding problems, bruising or swollen lymph nodes Breast ROS: negative Respiratory ROS: no cough, shortness of breath, or wheezing Cardiovascular ROS: no chest pain or dyspnea on exertion Gastrointestinal ROS: no abdominal pain, change in bowel habits, or black or bloody stools Genito-Urinary ROS: negative for - genital discharge, genital ulcers, incontinence or abnormal bleeding from genitals Musculoskeletal ROS: negative for - joint pain or muscle pain Neurological ROS: negative for - headaches or memory loss Dermatological ROS: negative for lumps, mole changes, rash and skin lesion changes  Past Medical History  Diagnosis Date  . Heart block, first degree   . Hypertension   . Essential hypertension 09/15/2014    Past Surgical History  Procedure Laterality Date  . Hernia repair  03/2006  . Knee surgery  10/1971   Family History  Problem Relation Age of Onset  . Lung cancer Maternal Grandfather   . Diabetes Maternal Grandmother     Social History   Social History  . Marital Status: Single    Spouse Name: N/A  . Number of Children: N/A  . Years of Education: N/A   Occupational History  . Not on file.   Social History  Main Topics  . Smoking status: Current Every Day Smoker -- 1.00 packs/day for 35 years    Types: Cigarettes  . Smokeless tobacco: Not on file  . Alcohol Use: 12.0 oz/week    20 Standard drinks or equivalent per week  . Drug Use: No  . Sexual Activity: Not Currently   Other Topics Concern  . Not on file   Social History Narrative     Objective: BP 148/89 mmHg  Pulse 76  Wt 159 lb (72.122 kg)  General: No Acute Distress HEENT: Atraumatic, normocephalic, conjunctivae normal without scleral icterus.  No nasal discharge, hearing grossly intact, TMs with good landmarks bilaterally with no middle ear abnormalities, posterior pharynx clear without oral lesions. Neck: Supple, trachea midline, no cervical nor supraclavicular adenopathy. Pulmonary: Clear to auscultation bilaterally without wheezing, rhonchi, nor rales. Cardiac: Regular rate and rhythm.  No murmurs, rubs, nor gallops. No peripheral edema.  2+ peripheral pulses bilaterally. Abdomen: Bowel sounds normal.  No masses.  Non-tender without rebound.  Negative Murphy's sign. MSK: Grossly intact, no signs of weakness.  Full strength throughout upper and lower extremities.  Full ROM in upper and lower extremities.  No midline spinal tenderness. Neuro: Gait unremarkable, CN II-XII grossly intact.  C5-C6 Reflex 2/4 Bilaterally, L4 Reflex 2/4 Bilaterally.  Cerebellar function intact. Skin: No rashes. Psych: Alert and oriented to person/place/time.  Thought process normal. No anxiety/depression.  Assessment & Plan: [name] was seen today for annual exam.  Diagnoses and all orders for this visit:  Annual physical exam -     PSA -     CBC -     COMPLETE  METABOLIC PANEL WITH GFR -     Lipid panel  Special screening for malignant neoplasms, colon -     Ambulatory referral to Gastroenterology   Healthy lifestyle interventions including but not limited to regular exercise, a healthy low fat diet, moderation of salt intake, the dangers  of tobacco/alcohol/recreational drug use, nutrition supplementation, and accident avoidance were discussed with the patient and a handout was provided for future reference.  Return in about 3 months (around 09/07/2015) for Mood.

## 2015-06-08 ENCOUNTER — Telehealth: Payer: Self-pay | Admitting: Family Medicine

## 2015-06-08 ENCOUNTER — Other Ambulatory Visit: Payer: Self-pay | Admitting: Family Medicine

## 2015-06-08 DIAGNOSIS — E785 Hyperlipidemia, unspecified: Secondary | ICD-10-CM | POA: Insufficient documentation

## 2015-06-08 DIAGNOSIS — R739 Hyperglycemia, unspecified: Secondary | ICD-10-CM | POA: Insufficient documentation

## 2015-06-08 LAB — PSA: PSA: 0.34 ng/mL (ref <=4.00)

## 2015-06-08 MED ORDER — ATORVASTATIN CALCIUM 10 MG PO TABS: 10.0000 mg | ORAL_TABLET | Freq: Every day | ORAL | Status: AC

## 2015-06-08 NOTE — Telephone Encounter (Signed)
Pt advised.

## 2015-06-08 NOTE — Telephone Encounter (Signed)
Will you please let patient know that his LDL cholesterol was elevated to a degree that I would recommend he start on a daily cholesterol lowering medication called atorvastatin. [facility] sent [facility] and would recommend he return in 3 months to recheck this.  Also his blood sugar was mildly elevated and I'd recommend he have his three month average blood sugar checked, lab slip in your in box. All other labs were normal.  His work paperwork is complete and in your in box.

## 2015-06-08 NOTE — Telephone Encounter (Signed)
Awaiting call back.

## 2015-08-18 ENCOUNTER — Other Ambulatory Visit: Payer: Self-pay | Admitting: Family Medicine

## 2015-09-19 IMAGING — CR DG CHEST 2V
2 series · 2 of 2 positions shown · non-contrast
Comparison: None.

CLINICAL DATA: Flu-like symptoms for 1 week

EXAM:
CHEST  2 VIEW

[view not recorded (1 of 2)]
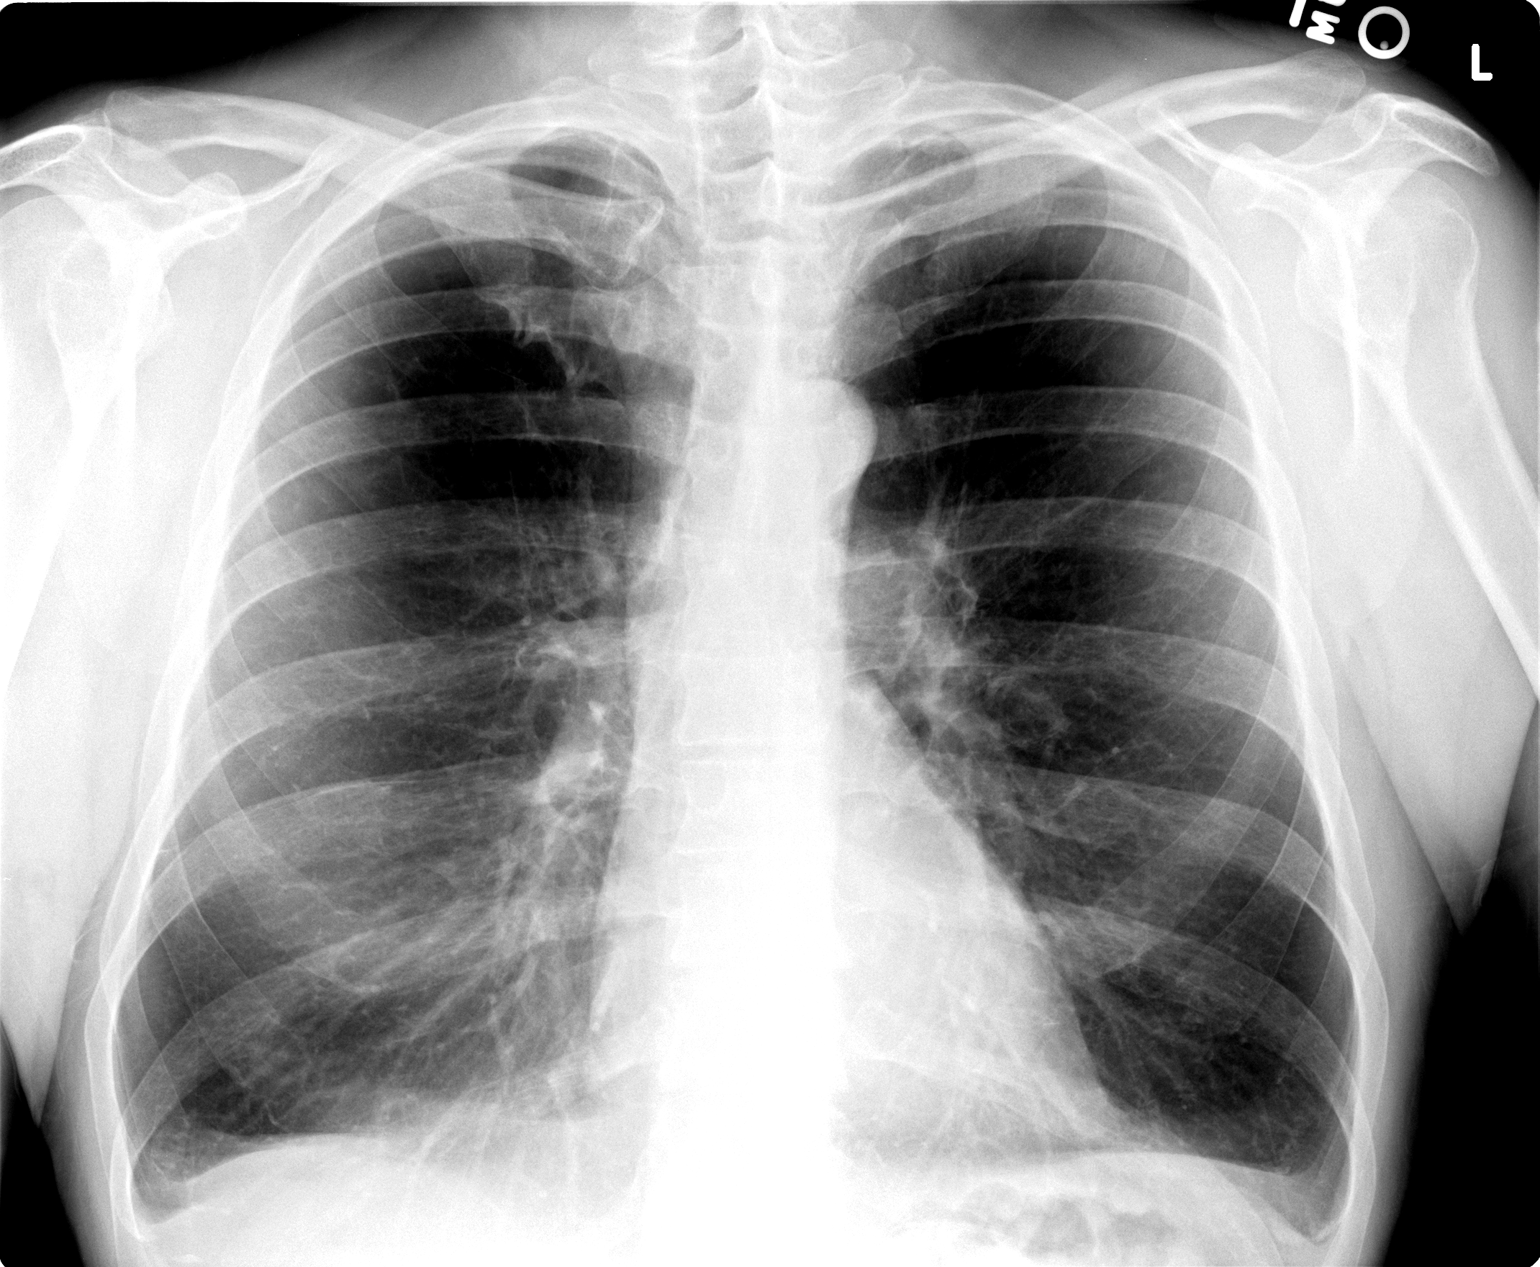

[view not recorded (2 of 2)]
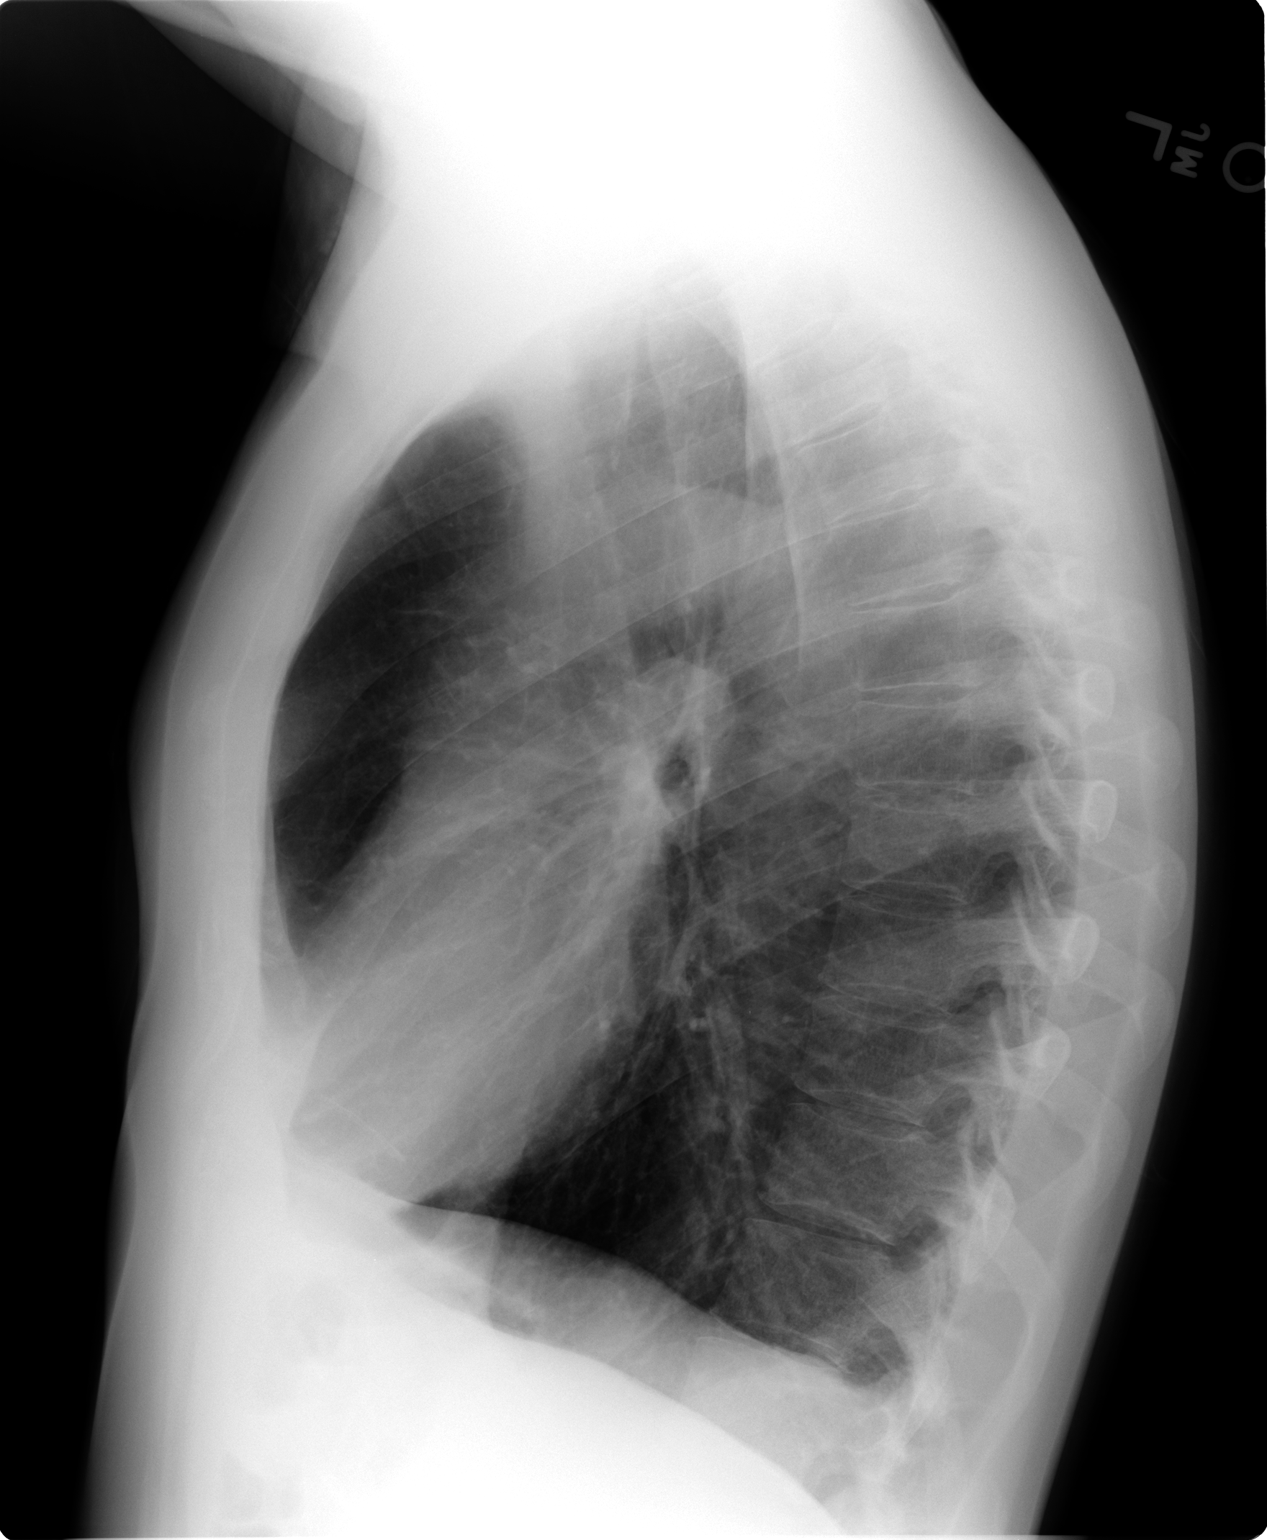

[2 of 2 positions shown; findings below may reference images not displayed]

FINDINGS: Cardiac shadow is within normal limits. The lungs are hyperaerated
bilaterally consistent with COPD. No focal infiltrate or sizable
effusion is seen. No bony abnormality is noted.
IMPRESSION: COPD without acute abnormality.

## 2015-09-22 ENCOUNTER — Encounter: Payer: Self-pay | Admitting: Family Medicine

## 2015-09-22 ENCOUNTER — Ambulatory Visit (INDEPENDENT_AMBULATORY_CARE_PROVIDER_SITE_OTHER): Payer: BLUE CROSS/BLUE SHIELD | Admitting: Family Medicine

## 2015-09-22 VITALS — BP 154/81 | HR 106 | Wt 158.0 lb

## 2015-09-22 DIAGNOSIS — F411 Generalized anxiety disorder: Secondary | ICD-10-CM | POA: Diagnosis not present

## 2015-09-22 DIAGNOSIS — R739 Hyperglycemia, unspecified: Secondary | ICD-10-CM | POA: Diagnosis not present

## 2015-09-22 DIAGNOSIS — E785 Hyperlipidemia, unspecified: Secondary | ICD-10-CM

## 2015-09-22 LAB — POCT GLYCOSYLATED HEMOGLOBIN (HGB A1C): Hemoglobin A1C: 5.1

## 2015-09-22 MED ORDER — LORAZEPAM 1 MG PO TABS: ORAL_TABLET | ORAL | Status: DC

## 2015-09-22 MED ORDER — VENLAFAXINE HCL ER 75 MG PO CP24: 75.0000 mg | ORAL_CAPSULE | Freq: Every day | ORAL | Status: DC

## 2015-09-22 NOTE — Addendum Note (Signed)
Addended by: Thom ChimesHENRY, Rahiem Schellinger M on: 09/22/2015 01:35 PM   Modules accepted: Orders

## 2015-09-22 NOTE — Progress Notes (Signed)
CC: Dean Osborne is a 55 y.o. male is here for Medication Refill   Subjective: HPI:  Follow-up anxiety: He tells me he is feeling great these days denies any depression or mental disturbance. He sleeping well every night and taking no more than a daily dose of Ativan. Denies any known side effects of the Effexor nor Ativan.  Follow-up hyperglycemia: He had a fasting blood sugar at 116 when he was here last and has yet to follow up on an A1c test. He denies any other known outside blood sugars to report. Denies polyuria or polyphagia polydipsia. Denies vision loss.  Follow-up hyperlipidemia: Atorvastatin was recommended after his fasting lipid panel however he is decided that he does not want to take any medication to help lower cholesterol.   Review Of Systems Outlined In HPI  Past Medical History  Diagnosis Date  . Heart block, first degree   . Hypertension   . Essential hypertension 09/15/2014    Past Surgical History  Procedure Laterality Date  . Hernia repair  03/2006  . Knee surgery  10/1971   Family History  Problem Relation Age of Onset  . Lung cancer Maternal Grandfather   . Diabetes Maternal Grandmother     Social History   Social History  . Marital Status: Single    Spouse Name: N/A  . Number of Children: N/A  . Years of Education: N/A   Occupational History  . Not on file.   Social History Main Topics  . Smoking status: Current Every Day Smoker -- 1.00 packs/day for 35 years    Types: Cigarettes  . Smokeless tobacco: Not on file  . Alcohol Use: 12.0 oz/week    20 Standard drinks or equivalent per week  . Drug Use: No  . Sexual Activity: Not Currently   Other Topics Concern  . Not on file   Social History Narrative     Objective: BP 154/81 mmHg  Pulse 106  Wt 158 lb (71.668 kg)  Vital signs reviewed. General: Alert and Oriented, No Acute Distress HEENT: Pupils equal, round, reactive to light. Conjunctivae clear.  External ears  unremarkable.  Moist mucous membranes. Lungs: Clear and comfortable work of breathing, speaking in full sentences without accessory muscle use. Cardiac: Regular rate and rhythm.  Neuro: CN II-XII grossly intact, gait normal. Extremities: No peripheral edema.  Strong peripheral pulses.  Mental Status: No depression, anxiety, nor agitation. Logical though process. Skin: Warm and dry.  Assessment & Plan: Dean Osborne was seen today for medication refill.  Diagnoses and all orders for this visit:  Generalized anxiety disorder -     venlafaxine XR (EFFEXOR XR) 75 MG 24 hr capsule; Take 1 capsule (75 mg total) by mouth daily with breakfast. -     LORazepam (ATIVAN) 1 MG tablet; Take 1 tablet every 8 hours as needed for anxiety.  Hyperglycemia  Hyperlipidemia   Generalized anxiety disorder: Controlled with Effexor and occasional use of Ativan Hyperglycemia: Due for A1c today Hyperlipidemia: Recommended that he start atorvastatin to reduce cardiovascular risk however he politely declines.  Return in about 6 months (around 03/24/2016) for mood.

## 2015-12-18 ENCOUNTER — Other Ambulatory Visit: Payer: Self-pay | Admitting: Family Medicine

## 2016-03-13 ENCOUNTER — Encounter: Payer: Self-pay | Admitting: Family Medicine

## 2016-03-13 DIAGNOSIS — F411 Generalized anxiety disorder: Secondary | ICD-10-CM

## 2016-03-13 MED ORDER — VENLAFAXINE HCL ER 75 MG PO CP24: 75.0000 mg | ORAL_CAPSULE | Freq: Every day | ORAL | 2 refills | Status: DC

## 2016-03-13 MED ORDER — LORAZEPAM 1 MG PO TABS: ORAL_TABLET | ORAL | 0 refills | Status: DC

## 2016-03-13 NOTE — Telephone Encounter (Signed)
[place], Rx placed in in-box ready for pickup/faxing.  

## 2016-05-01 ENCOUNTER — Telehealth: Payer: Self-pay

## 2016-05-01 NOTE — Telephone Encounter (Signed)
Dean LitesGregory called and would like a refill on Lorazepam. He is a former Dr Ivan AnchorsHommel patient. He didn't know that Dr Ivan AnchorsHommel left the practice. He is now living in [place] and is looking for a new PCP. He would like a 30 day supply sent to [facility] [place].  Call patient back at work - [phone]

## 2016-05-01 NOTE — Telephone Encounter (Signed)
Ok for 30 days supply

## 2016-05-02 MED ORDER — LORAZEPAM 1 MG PO TABS: ORAL_TABLET | ORAL | 0 refills | Status: AC

## 2016-05-02 NOTE — Telephone Encounter (Signed)
Printed prescription  

## 2016-05-02 NOTE — Telephone Encounter (Signed)
Patient advised medication will be faxed later today.

## 2016-06-12 ENCOUNTER — Other Ambulatory Visit: Payer: Self-pay

## 2016-06-12 DIAGNOSIS — F411 Generalized anxiety disorder: Secondary | ICD-10-CM

## 2016-06-12 MED ORDER — VENLAFAXINE HCL ER 75 MG PO CP24: 75.0000 mg | ORAL_CAPSULE | Freq: Every day | ORAL | 0 refills | Status: AC
